# Patient Record
Sex: Male | Born: 1995 | Hispanic: Yes | Marital: Single | Smoking: Never smoker
Health system: Southern US, Community
[De-identification: ages and names within clinical notes are randomized; demographics above are authoritative.]

---
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
## 2019-09-09 ENCOUNTER — Ambulatory Visit: Payer: Self-pay | Attending: Internal Medicine

## 2019-09-09 DIAGNOSIS — Z23 Encounter for immunization: Secondary | ICD-10-CM

## 2019-09-09 NOTE — Progress Notes (Signed)
   Covid-19 Vaccination Clinic  Name:  Tayden Nichelson    MRN: [id number] DOB: 1996-01-29  09/09/2019  Mr. Interrante was observed post Covid-19 immunization for 15 minutes without incident. He was provided with Vaccine Information Sheet and instruction to access the V-Safe system.   Mr. Slovacek was instructed to call 911 with any severe reactions post vaccine: Marland Kitchen Difficulty breathing  . Swelling of face and throat  . A fast heartbeat  . A bad rash all over body  . Dizziness and weakness   Immunizations Administered    Name Date Dose VIS Date Route   Pfizer COVID-19 Vaccine 09/09/2019 10:04 AM 0.3 mL 05/31/2019 Intramuscular   Manufacturer: [employer], [employer]   Lot: [id number]   NDC: [address]-3

## 2019-09-30 ENCOUNTER — Ambulatory Visit: Payer: Self-pay | Attending: Internal Medicine

## 2019-09-30 DIAGNOSIS — Z23 Encounter for immunization: Secondary | ICD-10-CM

## 2019-09-30 NOTE — Progress Notes (Signed)
   Covid-19 Vaccination Clinic  Name:  Alic Hilburn    MRN: [id number] DOB: 1995-07-03  09/30/2019  Mr. Arya was observed post Covid-19 immunization for 15 minutes without incident. He was provided with Vaccine Information Sheet and instruction to access the V-Safe system.   Mr. Standing was instructed to call 911 with any severe reactions post vaccine: Marland Kitchen Difficulty breathing  . Swelling of face and throat  . A fast heartbeat  . A bad rash all over body  . Dizziness and weakness   Immunizations Administered    Name Date Dose VIS Date Route   Pfizer COVID-19 Vaccine 09/30/2019  9:59 AM 0.3 mL 05/31/2019 Intramuscular   Manufacturer: [employer], [employer]   Lot: [id number]   NDC: [address]-7

## 2021-02-15 ENCOUNTER — Other Ambulatory Visit: Payer: Self-pay

## 2021-02-15 ENCOUNTER — Ambulatory Visit
Admission: EM | Admit: 2021-02-15 | Discharge: 2021-02-15 | Disposition: A | Discharge status: Home / Self Care | Payer: 59

## 2021-02-15 ENCOUNTER — Encounter: Payer: Self-pay | Admitting: Emergency Medicine

## 2021-02-15 DIAGNOSIS — S46812A Strain of other muscles, fascia and tendons at shoulder and upper arm level, left arm, initial encounter: Secondary | ICD-10-CM | POA: Diagnosis not present

## 2021-02-15 NOTE — Discharge Instructions (Addendum)
Follow up with PCP to inform of visit and treatment in clinic today as orthopedic or PT referral may be needed if symptoms persist longer than 2-3 weeks.  Increase fluid intake. Apply ice to affected area 3-5 times daily for 15-20 minute intervals. You may use heat in the morning and at night, but mostly rely on ice. Take all medications as prescribed. May take 1000 mg Tylenol  OR 600 mg ibuprofen every 8 hours as needed for pain as long as neither medication is contraindicated to your current health conditions. Return to clinic if symptoms worsen. If you experience shortness of breath, chest pain, dizziness, fainting or severe headache go to the ER.

## 2021-02-15 NOTE — ED Provider Notes (Signed)
Chief Complaint   Chief Complaint  Patient presents with   Shoulder Pain     Subjective, HPI  Henry Bennett is a 25 y.o. male who presents with left shoulder pain and subjective limited range of motion since yesterday.  Patient does not report any trauma or falls.  Patient states that he was laying on the couch and this pain started all of a sudden.  History obtained from patient.  Patient's problem list, past medical and social history, medications, and allergies were reviewed by me and updated in Epic.    ROS  See HPI.  Objective   Vitals:   02/15/21 1006  BP: (!) 142/92  Pulse: 90  Resp: 16  Temp: 97.7 F (36.5 C)  SpO2: 98%    Vital signs and nursing note reviewed.   General: Appears well-developed and well-nourished. No acute distress.  Head: Normocephalic and atraumatic.   Neck: Normal range of motion, neck is supple.  Cardiovascular: Normal rate. Pulm/Chest: No respiratory distress.  Musculoskeletal: Left shoulder: Moderate TTP and muscular tension to left trapezius.  No TTP to insertion point of left bicep or SITS muscles.  Patient retains range of motion at approximately 80% with 100% range of motion with provider assistance.  5/5 strength, full sensation, 2+  pulses, < 2 sec cap refill.  Neurological: Alert and oriented to person, place, and time.  Skin: Skin is warm and dry.   Psychiatric: Normal mood, affect, behavior, and thought content.    Data  No results found for any visits on 02/15/21.    Assessment & Plan  1. Strain of left trapezius muscle, initial encounter  25 y.o. male presents with left shoulder pain and subjective limited range of motion since yesterday.  Patient does not report any trauma or falls.  Patient states that he was laying on the couch and this pain started all of a sudden.  Given symptoms along with assessment findings, likely left trapezius strain.  Chart review completed.  No history of surgeries or injury to left  shoulder in the past.  Advised about home treatment and care to include ibuprofen, Tylenol, rest, ice and heat.  Demonstrated range of motion exercises to the patient in room and he verbalized understanding.  Advised to follow-up with PCP to inform of visit and treatment clinic today as he may need a referral to orthopedics or physical therapy if his symptoms persist for longer than 2 to 3 weeks.  Return as needed.  Stable on discharge.  Work note provided.  Plan:   Discharge Instructions      Follow up with PCP to inform of visit and treatment in clinic today as orthopedic or PT referral may be needed if symptoms persist longer than 2-3 weeks.  Increase fluid intake. Apply ice to affected area 3-5 times daily for 15-20 minute intervals. You may use heat in the morning and at night, but mostly rely on ice. Take all medications as prescribed. May take 1000 mg Tylenol  OR 600 mg ibuprofen every 8 hours as needed for pain as long as neither medication is contraindicated to your current health conditions. Return to clinic if symptoms worsen. If you experience shortness of breath, chest pain, dizziness, fainting or severe headache go to the ER.          Amalia Greenhouse, FNP 02/15/21 1057

## 2021-02-15 NOTE — ED Triage Notes (Signed)
Pt here with left shoulder pain with subjective limited ROM since yesterday. No mechanism of injury.
